# Patient Record
Sex: Male | Born: 1980
Health system: Southern US, Community
[De-identification: ages and names within clinical notes are randomized; demographics above are authoritative.]

## PROBLEM LIST (undated history)

## (undated) DIAGNOSIS — J45909 Unspecified asthma, uncomplicated: Secondary | ICD-10-CM

## (undated) DIAGNOSIS — R51 Headache: Secondary | ICD-10-CM

## (undated) DIAGNOSIS — G8929 Other chronic pain: Secondary | ICD-10-CM

## (undated) HISTORY — PX: BACK SURGERY: SHX140

## (undated) HISTORY — DX: Headache: R51

## (undated) HISTORY — DX: Other chronic pain: G89.29

## (undated) HISTORY — PX: OPEN ANTERIOR SHOULDER RECONSTRUCTION: SHX2100

## (undated) HISTORY — PX: BRONCHOSCOPY: SUR163

## (undated) HISTORY — DX: Unspecified asthma, uncomplicated: J45.909

---

## 2005-09-22 ENCOUNTER — Emergency Department (HOSPITAL_COMMUNITY): Admission: EM | Admit: 2005-09-22 | Discharge: 2005-09-22 | Payer: Self-pay | Admitting: Family Medicine

## 2007-08-06 ENCOUNTER — Encounter: Admission: RE | Admit: 2007-08-06 | Discharge: 2007-08-06 | Payer: Self-pay | Admitting: General Surgery

## 2009-03-11 ENCOUNTER — Encounter: Admission: RE | Admit: 2009-03-11 | Discharge: 2009-03-11 | Payer: Self-pay | Admitting: Internal Medicine

## 2009-03-29 ENCOUNTER — Emergency Department (HOSPITAL_COMMUNITY): Admission: EM | Admit: 2009-03-29 | Discharge: 2009-03-29 | Payer: Self-pay | Admitting: Emergency Medicine

## 2009-04-01 ENCOUNTER — Ambulatory Visit (HOSPITAL_COMMUNITY): Admission: RE | Admit: 2009-04-01 | Discharge: 2009-04-02 | Payer: Self-pay | Admitting: Neurological Surgery

## 2009-04-01 ENCOUNTER — Encounter: Payer: Self-pay | Admitting: Internal Medicine

## 2009-11-04 ENCOUNTER — Encounter: Admission: RE | Admit: 2009-11-04 | Discharge: 2009-11-04 | Payer: Self-pay | Admitting: Orthopedic Surgery

## 2009-12-01 ENCOUNTER — Ambulatory Visit: Payer: Self-pay | Admitting: Internal Medicine

## 2009-12-01 DIAGNOSIS — Z9189 Other specified personal risk factors, not elsewhere classified: Secondary | ICD-10-CM | POA: Insufficient documentation

## 2009-12-01 DIAGNOSIS — R05 Cough: Secondary | ICD-10-CM

## 2009-12-01 DIAGNOSIS — R059 Cough, unspecified: Secondary | ICD-10-CM | POA: Insufficient documentation

## 2010-01-04 ENCOUNTER — Ambulatory Visit: Payer: Self-pay | Admitting: Internal Medicine

## 2010-01-04 DIAGNOSIS — J45909 Unspecified asthma, uncomplicated: Secondary | ICD-10-CM | POA: Insufficient documentation

## 2010-01-04 LAB — CONVERTED CEMR LAB
Basophils Relative: 0.5 % (ref 0.0–3.0)
Eosinophils Absolute: 0.1 10*3/uL (ref 0.0–0.7)
Eosinophils Relative: 2.1 % (ref 0.0–5.0)
Hemoglobin: 15.7 g/dL (ref 13.0–17.0)
Lymphocytes Relative: 35.6 % (ref 12.0–46.0)
MCHC: 34.8 g/dL (ref 30.0–36.0)
MCV: 90.8 fL (ref 78.0–100.0)
Monocytes Absolute: 0.4 10*3/uL (ref 0.1–1.0)
Neutro Abs: 2.5 10*3/uL (ref 1.4–7.7)
RBC: 4.97 M/uL (ref 4.22–5.81)

## 2010-01-09 ENCOUNTER — Ambulatory Visit: Payer: Self-pay | Admitting: Cardiovascular Disease

## 2010-01-17 ENCOUNTER — Ambulatory Visit (HOSPITAL_BASED_OUTPATIENT_CLINIC_OR_DEPARTMENT_OTHER): Admission: RE | Admit: 2010-01-17 | Discharge: 2010-01-17 | Payer: Self-pay | Admitting: Orthopedic Surgery

## 2010-01-17 ENCOUNTER — Telehealth (INDEPENDENT_AMBULATORY_CARE_PROVIDER_SITE_OTHER): Payer: Self-pay | Admitting: *Deleted

## 2010-06-28 ENCOUNTER — Encounter: Admission: RE | Admit: 2010-06-28 | Discharge: 2010-06-28 | Payer: Self-pay | Admitting: Internal Medicine
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
## 2010-10-17 NOTE — Miscellaneous (Signed)
Summary: Orders Update pft charges  Clinical Lists Changes  Orders: Added new Service order of Carbon Monoxide diffusing w/capacity (94720) - Signed Added new Service order of Lung Volumes (94240) - Signed Added new Service order of Spirometry (Pre & Post) (94060) - Signed 

## 2010-10-17 NOTE — Progress Notes (Signed)
Summary: surgery clearance- fax req immediately ----waiting on fax  Phone Note From Other Clinic   Caller: kirsten w/ dr Thurston Hole Call For: wert Summary of Call: pt is having ortho surgery today at 1pm. caller says that they were to receive notes re: surgery clearance asap. says short stay had faxed something as well. fax to attn: kirsten [phone] Initial call taken by: Tivis Ringer, CNA,  Jan 17, 2010 10:45 AM  Follow-up for Phone Call        Verlon Au, have you seen any paperwork come across regarding this pt and surgical clearance?  pt scheduled for surgery today at 1pm.  Arman Filter LPN  Jan 17, 1061 10:58 AM   spoke with leslie. no paperwork on this pt.  called Kirsten with Dr. Thurston Hole at [phone] and requested she fax over surgical clearance papework for MW to fill out.  Waiting on fax. Arman Filter LPN  Jan 17, 2702 11:06 AM   recieved fax and gave to Verlon Au to give to [facility].  Aundra Millet Reynolds LPN  Jan 18, 5008 11:18 AM   Additional Follow-up for Phone Call Additional follow up Details #1::        last ov note from 01-04-2010 with addendum faxed to # above.  Arman Filter LPN  Jan 17, 3817 11:43 AM
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
## 2010-12-05 LAB — CBC
HCT: 44.3 % (ref 39.0–52.0)
MCHC: 35.5 g/dL (ref 30.0–36.0)
MCV: 91.3 fL (ref 78.0–100.0)
RBC: 4.85 MIL/uL (ref 4.22–5.81)

## 2010-12-05 LAB — DIFFERENTIAL
Eosinophils Absolute: 0.1 10*3/uL (ref 0.0–0.7)
Eosinophils Relative: 1 % (ref 0–5)
Lymphs Abs: 1.4 10*3/uL (ref 0.7–4.0)
Monocytes Absolute: 0.4 10*3/uL (ref 0.1–1.0)
Monocytes Relative: 8 % (ref 3–12)

## 2010-12-05 LAB — SEDIMENTATION RATE: Sed Rate: 1 mm/hr (ref 0–16)

## 2010-12-05 LAB — C-REACTIVE PROTEIN: CRP: 0 mg/dL — ABNORMAL LOW (ref <0.6)

## 2010-12-24 LAB — CBC
HCT: 48.9 % (ref 39.0–52.0)
Hemoglobin: 17.2 g/dL — ABNORMAL HIGH (ref 13.0–17.0)
MCHC: 35.2 g/dL (ref 30.0–36.0)
MCV: 90.7 fL (ref 78.0–100.0)
RBC: 5.39 MIL/uL (ref 4.22–5.81)
RDW: 13.3 % (ref 11.5–15.5)

## 2011-01-30 NOTE — Op Note (Signed)
NAME:  Luis [name], Luis [name] NO.:  [id number]   MEDICAL RECORD NO.:  [id number]          PATIENT TYPE:  OIB   LOCATION:  3524                         FACILITY:  [facility]   PHYSICIAN:  Stefani Dama, M.D.  DATE OF BIRTH:  1981/01/27   DATE OF PROCEDURE:  04/01/2009  DATE OF DISCHARGE:                               OPERATIVE REPORT   PREOPERATIVE DIAGNOSIS:  Herniated nucleus pulposus L5-S1 right with  right lumbar radiculopathy.   POSTOPERATIVE DIAGNOSIS:  Herniated nucleus pulposus L5-S1 right with  right lumbar radiculopathy.   PROCEDURES:  Right L5-S1 laminotomy, diskectomy with METRx operating  microscope, microdissection technique.   SURGEON:  Stefani Dama, MD   FIRST ASSISTANT:  Hilda Lias, MD   ANESTHESIA:  General endotracheal.   INDICATIONS:  Luis [name] is a 30 year old individual who has had  significant back and right lower extremity pain with weakness in the  gastroc on the right side.  He was seen in the office and found to be in  acute and severe distress having had failed efforts at conservative  management for the last couple of months.  An MRI demonstrates presence  of a large extruded fragment of disk at L5-S1 on the right side.  He was  advised regarding METRx diskectomy.   PROCEDURE:  The patient was brought to the operating room in supine on  the stretcher.  After smooth induction of general endotracheal  anesthesia, he was turned prone.  The back was prepped with alcohol and  DuraPrep and draped in a sterile fashion.  Fluoroscopic guidance was  then used to localize the interlaminar space on the right side at the L5-  S1 level and both the AP and lateral projections.  A small incision was  made in the skin overlying this area of the entry site and then a K-wire  was passed to laminar arch of L5.  A vertical incision was created along  the K-wire to allow easier access to the dissection.  A series of  dilators were then passed over  the K-wires and a wanding technique was  used to develop an area of the interlaminar space at L5-S1 on the right  side.  Ultimately, an 18-mm diameter cannula x 7 cm in depth was fixed  to the operating table with a clamp, and the soft tissues were then  cleared over the interlaminar space using a monopolar cautery with the  operating microscope in place.  Laminotomy was then created removing the  inferior margin lamina of L5 out to and including the medial wall of the  facet.  Yellow ligament was taken up in this interspace and lateral  recess was cleared.  Takeoff of the L5 nerve root was then identified  superiorly and inferiorly under a substantial mass.  The S1 nerve root  was identified being splayed over a mass.  The S1 nerve root was  carefully dissected and retracted medially, and the mass was identified  as a large pearlescent mass of disk.  Dissection of this thinned  ligament over the disk yielded a large fragment of  disk which was  removed.  Further dissection yielded some other fragments of disk, which  were contiguous with the interspace.  Interspace was then opened and a  diskectomy was performed within the interspace cleaning out both the  medial and lateral degenerated disk material.  Care was taken to make  sure that all the free fragments of disk that could be harvested from  within the disk space itself were removed.  This was done with a  combination of curettes, rongeurs.  Irrigation and probing with various  probes to make sure that the disk space itself was clean.  Once this was  completed, hemostasis in the soft tissues was obtained and the wound was  inspected carefully.  The S1 nerve root was noted to be well  decompressed and laid flat across the interspace.  The L5 nerve root was  noted to be intact, dural tube was well decompressed.  Hemostasis was  well maintained.  At this point, endoscopic cannula was removed.  The  lumbodorsal fascia was closed with  single layer of 3-0 Vicryl stitch and  3-0 Vicryl was used in the subcuticular tissues.  Blood loss was  estimated about 50 mL.  The patient tolerated the procedure well and was  returned to recovery room in stable condition.      Stefani Dama, M.D.  Electronically Signed     HJE/MEDQ  D:  04/01/2009  T:  04/02/2009  Job:  [id number]

## 2014-05-06 ENCOUNTER — Other Ambulatory Visit: Payer: Self-pay | Admitting: Internal Medicine

## 2014-05-06 DIAGNOSIS — M545 Low back pain, unspecified: Secondary | ICD-10-CM

## 2014-05-06 DIAGNOSIS — M546 Pain in thoracic spine: Secondary | ICD-10-CM

## 2014-05-13 ENCOUNTER — Other Ambulatory Visit: Payer: Self-pay

## 2014-05-19 ENCOUNTER — Ambulatory Visit
Admission: RE | Admit: 2014-05-19 | Discharge: 2014-05-19 | Disposition: A | Discharge status: Home / Self Care | Payer: BC Managed Care – PPO | Source: Ambulatory Visit | Attending: Internal Medicine | Admitting: Internal Medicine

## 2014-05-19 DIAGNOSIS — M546 Pain in thoracic spine: Secondary | ICD-10-CM

## 2014-05-19 DIAGNOSIS — M545 Low back pain, unspecified: Secondary | ICD-10-CM

## 2014-05-19 MED ORDER — GADOBENATE DIMEGLUMINE 529 MG/ML IV SOLN
20.0000 mL | Freq: Once | INTRAVENOUS | Status: AC | PRN
  Administered 2014-05-19: 20 mL via INTRAVENOUS

## 2016-06-04 NOTE — Progress Notes (Signed)
Luis [name], this gentleman would like to see me in clinic, next available consult please

## 2016-06-09 NOTE — Progress Notes (Signed)
He has been told he has bronchiectasis and [facility] and now wants to see me.  Dr. Ninetta LightsHatcher called me to discuss and requested I see him about a week ago.  No rush, just next available consult.

## 2016-07-19 ENCOUNTER — Ambulatory Visit (INDEPENDENT_AMBULATORY_CARE_PROVIDER_SITE_OTHER): Payer: BLUE CROSS/BLUE SHIELD | Admitting: Pulmonary Disease

## 2016-07-19 ENCOUNTER — Encounter: Payer: Self-pay | Admitting: Pulmonary Disease

## 2016-07-19 VITALS — BP 126/78 | HR 83 | Ht 71.0 in | Wt 247.0 lb

## 2016-07-19 DIAGNOSIS — J479 Bronchiectasis, uncomplicated: Secondary | ICD-10-CM | POA: Diagnosis not present
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
## 2016-07-19 NOTE — Patient Instructions (Signed)
We will arrange for a sputum culture, high-resolution CT scan of your chest in lung function test We will see you back in 4-6 weeks to go over these results

## 2016-08-03 ENCOUNTER — Other Ambulatory Visit: Payer: BLUE CROSS/BLUE SHIELD

## 2016-08-03 ENCOUNTER — Ambulatory Visit (INDEPENDENT_AMBULATORY_CARE_PROVIDER_SITE_OTHER)
Admission: RE | Admit: 2016-08-03 | Discharge: 2016-08-03 | Disposition: A | Discharge status: Home / Self Care | Payer: BLUE CROSS/BLUE SHIELD | Source: Ambulatory Visit | Attending: Pulmonary Disease | Admitting: Pulmonary Disease

## 2016-08-03 DIAGNOSIS — J479 Bronchiectasis, uncomplicated: Secondary | ICD-10-CM | POA: Diagnosis not present

## 2016-08-03 DIAGNOSIS — R05 Cough: Secondary | ICD-10-CM | POA: Diagnosis not present

## 2016-08-07 LAB — RESPIRATORY CULTURE OR RESPIRATORY AND SPUTUM CULTURE: Gram Stain: NONE SEEN

## 2016-08-23 ENCOUNTER — Ambulatory Visit (INDEPENDENT_AMBULATORY_CARE_PROVIDER_SITE_OTHER): Payer: BLUE CROSS/BLUE SHIELD | Admitting: Pulmonary Disease

## 2016-08-23 ENCOUNTER — Encounter: Payer: Self-pay | Admitting: Pulmonary Disease

## 2016-08-23 VITALS — BP 122/80 | HR 88 | Ht 71.0 in | Wt 248.0 lb

## 2016-08-23 DIAGNOSIS — J479 Bronchiectasis, uncomplicated: Secondary | ICD-10-CM

## 2016-08-23 DIAGNOSIS — K760 Fatty (change of) liver, not elsewhere classified: Secondary | ICD-10-CM

## 2016-08-23 LAB — PULMONARY FUNCTION TEST
DL/VA % pred: 108 %
DL/VA: 5.15 ml/min/mmHg/L
DLCO UNC: 35.63 ml/min/mmHg
DLCO cor % pred: 100 %
DLCO cor: 33.78 ml/min/mmHg
DLCO unc % pred: 105 %
FEF 25-75 PRE: 2.51 L/s
FEF 25-75 Post: 3.19 L/sec
FEF2575-%Change-Post: 27 %
FEF2575-%PRED-POST: 74 %
FEF2575-%Pred-Pre: 58 %
FEV1-%CHANGE-POST: 8 %
FEV1-%PRED-POST: 80 %
FEV1-%PRED-PRE: 74 %
FEV1-PRE: 3.31 L
FEV1-Post: 3.58 L
FEV1FVC-%Change-Post: 2 %
FEV1FVC-%Pred-Pre: 87 %
FEV6-%CHANGE-POST: 7 %
FEV6-%PRED-POST: 90 %
FEV6-%PRED-PRE: 84 %
FEV6-POST: 4.92 L
FEV6-Pre: 4.59 L
FEV6FVC-%CHANGE-POST: 1 %
FEV6FVC-%PRED-PRE: 100 %
FEV6FVC-%Pred-Post: 101 %
FVC-%CHANGE-POST: 5 %
FVC-%Pred-Post: 88 %
FVC-%Pred-Pre: 83 %
FVC-Post: 4.92 L
FVC-Pre: 4.65 L
POST FEV6/FVC RATIO: 100 %
PRE FEV6/FVC RATIO: 99 %
Post FEV1/FVC ratio: 73 %
Pre FEV1/FVC ratio: 71 %
RV % PRED: 71 %
RV: 1.29 L
TLC % pred: 89 %
TLC: 6.4 L
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
## 2016-08-23 NOTE — Patient Instructions (Signed)
For your bronchiectasis: Clear out mucus twice a day with coughing technique or with the flutter valve If you find that you have increasing congestion, shortness of breath, or mucus production then I recommend that she use the flutter valve 4-5 breaths, 4-5 times a day. If your symptoms worsen or persist despite this for 3-4 days and call me and I will call in a prescription for an antibiotic. Focus on good nutrition, specifically whole foods fresh fruits and vegetables  For the fatty liver: I recommend that she follow-up with her primary care physician and talk about this further.  We will see you back in one year or sooner if needed

## 2016-08-29 DIAGNOSIS — M4714 Other spondylosis with myelopathy, thoracic region: Secondary | ICD-10-CM | POA: Diagnosis not present

## 2016-08-30 ENCOUNTER — Telehealth: Payer: Self-pay | Admitting: Pulmonary Disease

## 2016-08-30 NOTE — Telephone Encounter (Signed)
LMTCB

## 2016-09-03 NOTE — Telephone Encounter (Signed)
Called and spoke to pt. Pt is requesting to pick up sputum results. Results have been printed and placed up front for pick up.  Pt verbalized understanding and denied any further questions or concerns at this time.

## 2016-09-06 DIAGNOSIS — M546 Pain in thoracic spine: Secondary | ICD-10-CM | POA: Diagnosis not present

## 2016-09-06 DIAGNOSIS — M4714 Other spondylosis with myelopathy, thoracic region: Secondary | ICD-10-CM | POA: Diagnosis not present

## 2016-09-10 LAB — FUNGUS CULTURE W SMEAR

## 2016-09-20 LAB — AFB CULTURE WITH SMEAR (NOT AT ARMC)

## 2016-10-11 DIAGNOSIS — M542 Cervicalgia: Secondary | ICD-10-CM | POA: Diagnosis not present

## 2016-10-11 DIAGNOSIS — M25532 Pain in left wrist: Secondary | ICD-10-CM | POA: Diagnosis not present

## 2016-10-11 DIAGNOSIS — M25522 Pain in left elbow: Secondary | ICD-10-CM | POA: Diagnosis not present

## 2016-10-22 DIAGNOSIS — M25832 Other specified joint disorders, left wrist: Secondary | ICD-10-CM | POA: Diagnosis not present

## 2016-10-22 DIAGNOSIS — R52 Pain, unspecified: Secondary | ICD-10-CM | POA: Diagnosis not present

## 2016-10-30 ENCOUNTER — Other Ambulatory Visit: Payer: Self-pay | Admitting: Orthopedic Surgery

## 2016-10-30 DIAGNOSIS — M25832 Other specified joint disorders, left wrist: Secondary | ICD-10-CM

## 2016-10-30 DIAGNOSIS — R52 Pain, unspecified: Secondary | ICD-10-CM | POA: Diagnosis not present

## 2016-11-01 DIAGNOSIS — M4714 Other spondylosis with myelopathy, thoracic region: Secondary | ICD-10-CM | POA: Diagnosis not present

## 2016-11-19 ENCOUNTER — Ambulatory Visit
Admission: RE | Admit: 2016-11-19 | Discharge: 2016-11-19 | Disposition: A | Discharge status: Home / Self Care | Payer: BLUE CROSS/BLUE SHIELD | Source: Ambulatory Visit | Attending: Orthopedic Surgery | Admitting: Orthopedic Surgery

## 2016-11-19 DIAGNOSIS — S63502A Unspecified sprain of left wrist, initial encounter: Secondary | ICD-10-CM | POA: Diagnosis not present

## 2016-11-19 DIAGNOSIS — M25832 Other specified joint disorders, left wrist: Secondary | ICD-10-CM

## 2016-11-19 DIAGNOSIS — M25532 Pain in left wrist: Secondary | ICD-10-CM | POA: Diagnosis not present

## 2016-11-19 MED ORDER — IOPAMIDOL (ISOVUE-M 200) INJECTION 41%
2.0000 mL | Freq: Once | INTRAMUSCULAR | Status: AC
  Administered 2016-11-19: 2 mL via INTRA_ARTICULAR

## 2016-11-26 DIAGNOSIS — M25532 Pain in left wrist: Secondary | ICD-10-CM | POA: Diagnosis not present

## 2016-11-26 DIAGNOSIS — M24832 Other specific joint derangements of left wrist, not elsewhere classified: Secondary | ICD-10-CM | POA: Diagnosis not present

## 2016-12-13 ENCOUNTER — Telehealth: Payer: Self-pay | Admitting: Pulmonary Disease

## 2016-12-13 NOTE — Telephone Encounter (Signed)
BQ  Please Advise- Sick Message   Pt called in stating for the past 3-4 days, His throat has been irritated, and experiencing  right ear pain,does have a cough, coughing up greenish to yellow phlegm with some hints of blood, Denies increase sob,wheezing,fever. Pt wanted to know if an antibiotic could be called in.    No Known Allergies

## 2016-12-13 NOTE — Telephone Encounter (Signed)
lmomtcb x1 

## 2016-12-13 NOTE — Telephone Encounter (Signed)
cipro 750 mg bid x 7 days Take with a probiotic

## 2016-12-17 NOTE — Telephone Encounter (Signed)
lmtcb for pt.  

## 2016-12-18 NOTE — Telephone Encounter (Signed)
Spoke with the pt  He states currently out of town due to death of a family member  He does not feel like he needs rx for abx at this time He will call us when he gets back if needed

## 2017-05-17 ENCOUNTER — Encounter (HOSPITAL_COMMUNITY): Payer: Self-pay | Admitting: *Deleted

## 2017-05-17 ENCOUNTER — Emergency Department (HOSPITAL_COMMUNITY): Payer: BLUE CROSS/BLUE SHIELD

## 2017-05-17 ENCOUNTER — Emergency Department (HOSPITAL_COMMUNITY)
Admission: EM | Admit: 2017-05-17 | Discharge: 2017-05-17 | Disposition: A | Discharge status: Home / Self Care | Payer: BLUE CROSS/BLUE SHIELD | Attending: Emergency Medicine | Admitting: Emergency Medicine

## 2017-05-17 DIAGNOSIS — R2689 Other abnormalities of gait and mobility: Secondary | ICD-10-CM

## 2017-05-17 DIAGNOSIS — J45909 Unspecified asthma, uncomplicated: Secondary | ICD-10-CM | POA: Diagnosis not present

## 2017-05-17 DIAGNOSIS — R270 Ataxia, unspecified: Secondary | ICD-10-CM | POA: Diagnosis not present

## 2017-05-17 DIAGNOSIS — R51 Headache: Secondary | ICD-10-CM | POA: Diagnosis not present

## 2017-05-17 DIAGNOSIS — R519 Headache, unspecified: Secondary | ICD-10-CM

## 2017-05-17 LAB — BASIC METABOLIC PANEL
Anion gap: 8 (ref 5–15)
BUN: 12 mg/dL (ref 6–20)
CALCIUM: 9.1 mg/dL (ref 8.9–10.3)
CHLORIDE: 108 mmol/L (ref 101–111)
CO2: 24 mmol/L (ref 22–32)
CREATININE: 0.88 mg/dL (ref 0.61–1.24)
GFR calc Af Amer: >60 mL/min (ref >60)
GFR calc non Af Amer: >60 mL/min (ref >60)
GLUCOSE: 98 mg/dL (ref 65–99)
POTASSIUM: 4.8 mmol/L (ref 3.5–5.1)
Sodium: 140 mmol/L (ref 135–145)

## 2017-05-17 LAB — CBC
HEMATOCRIT: 46.7 % (ref 39.0–52.0)
HEMOGLOBIN: 15.7 g/dL (ref 13.0–17.0)
MCH: 31.7 pg (ref 26.0–34.0)
MCHC: 33.6 g/dL (ref 30.0–36.0)
MCV: 94.2 fL (ref 78.0–100.0)
Platelets: 196 10*3/uL (ref 150–400)
RBC: 4.96 MIL/uL (ref 4.22–5.81)
RDW: 12.6 % (ref 11.5–15.5)
WBC: 5.5 10*3/uL (ref 4.0–10.5)

## 2017-05-17 MED ORDER — METOCLOPRAMIDE HCL 10 MG PO TABS
10.0000 mg | ORAL_TABLET | Freq: Once | ORAL | Status: AC
  Administered 2017-05-17: 10 mg via ORAL
  Filled 2017-05-17: qty 1

## 2017-05-17 MED ORDER — GADOBENATE DIMEGLUMINE 529 MG/ML IV SOLN
20.0000 mL | Freq: Once | INTRAVENOUS | Status: AC
  Administered 2017-05-17: 20 mL via INTRAVENOUS

## 2017-05-17 MED ORDER — MECLIZINE HCL 25 MG PO TABS: 25.0000 mg | ORAL_TABLET | Freq: Three times a day (TID) | ORAL | 0 refills | Status: DC | PRN

## 2017-05-17 MED ORDER — DIPHENHYDRAMINE HCL 25 MG PO CAPS
25.0000 mg | ORAL_CAPSULE | Freq: Once | ORAL | Status: AC
  Administered 2017-05-17: 25 mg via ORAL
  Filled 2017-05-17: qty 1

## 2017-05-17 NOTE — Discharge Instructions (Signed)
Stay well hydrated.  Take meclizine as needed for dizziness. This can sometimes make people tired, so have caution while taking it before driving or operating heavy machinery. You may use Tylenol or ibuprofen as needed for headache. Have caution when walking to prevent falls.  Do not drive if symptoms persist. Follow-up with neurology.  Return to emergency department if you develop vomiting, fevers, chills, loss of bowel or bladder control, vision changes, or any new or worsening symptoms.
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
## 2017-05-17 NOTE — ED Notes (Signed)
Patient transported to CT 

## 2017-05-17 NOTE — ED Triage Notes (Signed)
Pt reports intermittent sharp stabbing pains to top of his head. The pain started yesterday. Reports that when pain occurs, it causes his right leg to "jump" and he has some numbness to right side of face. No neuro deficits are noted at triage, ambulatory on arrival. Episodes last only a few seconds each.

## 2017-05-17 NOTE — ED Notes (Signed)
Patient transported to MRI 

## 2017-05-29 ENCOUNTER — Telehealth: Payer: Self-pay | Admitting: Pulmonary Disease

## 2017-05-29 MED ORDER — LEVOFLOXACIN 750 MG PO TABS: 750.0000 mg | ORAL_TABLET | Freq: Every day | ORAL | 0 refills | Status: DC

## 2017-05-29 NOTE — Telephone Encounter (Signed)
Spoke with patient. He is aware of BQ's recs. Will go ahead and order the abx. He is aware. Nothing else needed at time of call.

## 2017-05-29 NOTE — Telephone Encounter (Signed)
Levaquin 750mg  daily x7 days Urologist: I really like [employer]ell ChemicalMatt Eskridge

## 2017-05-29 NOTE — Telephone Encounter (Signed)
Spoke with patient. He stated that his sore throat and cough started last night. Productive cough with green mucus. Denies any SOB or chest pain. He also has a slight headache. He wants to know if BQ could call in an abx to help with his symptoms.   Wishes to use [facility] on [address].   He also wanted to know if BQ could write a referral for him to see a urologist. He explained that he was in the hospital a few weeks ago for a UTI and they told him he needed one. He really trusts BQ's advice and only wants his opinion on a urologist.   BQ, please advise.

## 2017-09-18 ENCOUNTER — Telehealth: Payer: Self-pay | Admitting: Pulmonary Disease

## 2017-09-18 MED ORDER — LEVOFLOXACIN 750 MG PO TABS: 750.0000 mg | ORAL_TABLET | Freq: Every day | ORAL | 0 refills | Status: DC

## 2017-09-18 NOTE — Telephone Encounter (Signed)
Spoke with pt. He is aware of BQ's recommendations. Rx has been sent in. Nothing further was needed. 

## 2017-09-18 NOTE — Telephone Encounter (Signed)
Levaquin 750mg  po daily x 7 days Take with probiotic, fluids Call if no improvement

## 2017-09-18 NOTE — Telephone Encounter (Signed)
Called pt who stated that Saturday, 09/15/17 woke up with a high fever of 103, drainage, headache, chills, and cough. Fever broke Saturday night but then fever came back some Sunday.  Pt still having symptoms of sore throat, headache, cough with green mucus and is wanting to know if an Rx can be sent in to help with symptoms.  Dr. Kendrick FriesMcQuaid, please advise on this.  Thanks!

## 2017-09-27 DIAGNOSIS — M5481 Occipital neuralgia: Secondary | ICD-10-CM | POA: Diagnosis not present

## 2017-10-10 ENCOUNTER — Emergency Department (HOSPITAL_COMMUNITY)
Admission: EM | Admit: 2017-10-10 | Discharge: 2017-10-10 | Disposition: A | Discharge status: Home / Self Care | Payer: BLUE CROSS/BLUE SHIELD | Attending: Emergency Medicine | Admitting: Emergency Medicine

## 2017-10-10 ENCOUNTER — Other Ambulatory Visit: Payer: Self-pay

## 2017-10-10 ENCOUNTER — Encounter (HOSPITAL_COMMUNITY): Payer: Self-pay

## 2017-10-10 DIAGNOSIS — J45909 Unspecified asthma, uncomplicated: Secondary | ICD-10-CM | POA: Diagnosis not present

## 2017-10-10 DIAGNOSIS — M5441 Lumbago with sciatica, right side: Secondary | ICD-10-CM | POA: Diagnosis not present

## 2017-10-10 DIAGNOSIS — W19XXXA Unspecified fall, initial encounter: Secondary | ICD-10-CM

## 2017-10-10 DIAGNOSIS — M545 Low back pain: Secondary | ICD-10-CM | POA: Diagnosis present

## 2017-10-10 MED ORDER — DEXAMETHASONE SODIUM PHOSPHATE 10 MG/ML IJ SOLN
10.0000 mg | Freq: Once | INTRAMUSCULAR | Status: AC
  Administered 2017-10-10: 10 mg via INTRAMUSCULAR
  Filled 2017-10-10: qty 1

## 2017-10-10 MED ORDER — CYCLOBENZAPRINE HCL 10 MG PO TABS
5.0000 mg | ORAL_TABLET | Freq: Once | ORAL | Status: DC
  Filled 2017-10-10: qty 1

## 2017-10-10 MED ORDER — KETOROLAC TROMETHAMINE 15 MG/ML IJ SOLN
15.0000 mg | Freq: Once | INTRAMUSCULAR | Status: DC
  Filled 2017-10-10: qty 1

## 2017-10-10 MED ORDER — CYCLOBENZAPRINE HCL 10 MG PO TABS: 10.0000 mg | ORAL_TABLET | Freq: Two times a day (BID) | ORAL | 0 refills | Status: DC | PRN

## 2017-10-10 MED ORDER — IBUPROFEN 800 MG PO TABS
800.0000 mg | ORAL_TABLET | Freq: Once | ORAL | Status: AC
  Administered 2017-10-10: 800 mg via ORAL
  Filled 2017-10-10: qty 1
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
## 2017-10-10 NOTE — ED Provider Notes (Signed)
The patient is a 37 year old male, he has a known his prior lumbar surgery, he had this surgery in the past when he had a disc injury, he is required 2 separate surgeries and did great with both of them however he states that in the last couple of weeks he had an incident where a keg of beer was falling down the stairs and when he reached out to grab for it he had acute onset of pain and since that time has had an area of numbness and pain that radiates around from his right lower back around the proximal thigh over to the medial thigh.  This is right in the middle of the thigh, it does not involve the groin, the scrotum, the penis or the testicles.  He has no weakness of the leg and on my exam in fact he has a very normal neurologic exam except for a very slight area of decreased sensation to the mid anterior and lateral thigh rotating up towards the lower back.  He is able to flex with normal strength at the hips and the knees and extend and dorsiflex and plantarflex at the feet, normal ability to raise the great toes and normal reflexes.  I suspect that the patient has an L3 radiculopathy, he has a benign abdomen on my exam.  The patient is stable for discharge on Decadron and Toradol, he declines hydrocodone and Artie has an appointment with his neurosurgeon.  He is aware of the indications for return all the red flags were explained.  Medications  dexamethasone (DECADRON) injection 10 mg (10 mg Intramuscular Given 10/10/17 2314)  ibuprofen (ADVIL,MOTRIN) tablet 800 mg (800 mg Oral Given 10/10/17 2325)   I saw and evaluated the patient, reviewed the resident's note and I agree with the findings and plan.   Final diagnoses:  Acute right-sided low back pain with right-sided sciatica  Fall, initial encounter      Eber HongMiller, Alichia Alridge, MD 10/11/17 1034

## 2017-10-10 NOTE — ED Triage Notes (Signed)
Pt states he fell today and has lower back pain. Hx of severe back pain with injury. Pt states pain radiates into his left leg. States he now has pain in his right testicle. Ambulatory.

## 2017-10-10 NOTE — ED Notes (Signed)
Patient reports that he is here due to fall that "exacerbate my back pain". He reports that he has had two "back surgeries" in the past. He denies taking any home pain meds. He rates his pain at 7 on a 0-10scale

## 2017-11-21 ENCOUNTER — Telehealth: Payer: Self-pay | Admitting: Pulmonary Disease

## 2017-11-21 MED ORDER — DOXYCYCLINE HYCLATE 100 MG PO TABS: 100.0000 mg | ORAL_TABLET | Freq: Two times a day (BID) | ORAL | 0 refills | Status: DC

## 2017-11-21 NOTE — Telephone Encounter (Signed)
Offer doxycycline 100 mg, # 14, 1 twice daily 

## 2017-11-21 NOTE — Telephone Encounter (Addendum)
lmtcb for pt.  Rx sent to pharmacy pt requested when he called in - [facility] on [address].

## 2017-11-21 NOTE — Telephone Encounter (Signed)
Pt returned call. Informed him of the recs per CY. Pt verbalized understanding and denied any further questions or concerns at this time.   

## 2017-11-21 NOTE — Telephone Encounter (Signed)
Pt reports of prod cough with bright green mucus & chest tightness x1d Denies fever, chills, sweats or body aches.  recently flew and thinks he picked something up from the plane.   Pt was last seen 08/2016 and instructed to f/u in one year. Pt did not f/u. Over due f/u has been scheduled for 12/16/17.  CY please advise, as BQ is unavailable. Thanks  Current Outpatient Medications on File Prior to Visit  Medication Sig Dispense Refill  . cyclobenzaprine (FLEXERIL) 10 MG tablet Take 1 tablet (10 mg total) by mouth 2 (two) times daily as needed for muscle spasms. 20 tablet 0  . ibuprofen (ADVIL,MOTRIN) 200 MG tablet Take 600-800 mg by mouth every 6 (six) hours as needed for headache.    . levofloxacin (LEVAQUIN) 750 MG tablet Take 1 tablet (750 mg total) by mouth daily. (Patient not taking: Reported on 10/10/2017) 7 tablet 0  . meclizine (ANTIVERT) 25 MG tablet Take 1 tablet (25 mg total) by mouth 3 (three) times daily as needed for dizziness. (Patient not taking: Reported on 10/10/2017) 30 tablet 0   No current facility-administered medications on file prior to visit.     No Known Allergies

## 2017-11-22 ENCOUNTER — Telehealth: Payer: Self-pay | Admitting: Pulmonary Disease

## 2017-11-22 ENCOUNTER — Encounter: Payer: Self-pay | Admitting: Internal Medicine

## 2017-11-22 ENCOUNTER — Ambulatory Visit (INDEPENDENT_AMBULATORY_CARE_PROVIDER_SITE_OTHER): Payer: BLUE CROSS/BLUE SHIELD | Admitting: Internal Medicine

## 2017-11-22 ENCOUNTER — Ambulatory Visit (INDEPENDENT_AMBULATORY_CARE_PROVIDER_SITE_OTHER)
Admission: RE | Admit: 2017-11-22 | Discharge: 2017-11-22 | Disposition: A | Discharge status: Home / Self Care | Payer: BLUE CROSS/BLUE SHIELD | Source: Ambulatory Visit | Attending: Internal Medicine | Admitting: Internal Medicine

## 2017-11-22 VITALS — BP 118/86 | HR 118 | Temp 102.8°F | Ht 71.0 in | Wt 242.0 lb

## 2017-11-22 DIAGNOSIS — R05 Cough: Secondary | ICD-10-CM | POA: Diagnosis not present

## 2017-11-22 DIAGNOSIS — J479 Bronchiectasis, uncomplicated: Secondary | ICD-10-CM | POA: Diagnosis not present

## 2017-11-22 DIAGNOSIS — J101 Influenza due to other identified influenza virus with other respiratory manifestations: Secondary | ICD-10-CM

## 2017-11-22 DIAGNOSIS — R0602 Shortness of breath: Secondary | ICD-10-CM | POA: Diagnosis not present

## 2017-11-22 DIAGNOSIS — R059 Cough, unspecified: Secondary | ICD-10-CM

## 2017-11-22 LAB — POCT INFLUENZA A/B
INFLUENZA A, POC: POSITIVE — AB
Influenza B, POC: NEGATIVE

## 2017-11-22 MED ORDER — OSELTAMIVIR PHOSPHATE 75 MG PO CAPS: 75.0000 mg | ORAL_CAPSULE | Freq: Two times a day (BID) | ORAL | 0 refills | Status: DC

## 2017-11-22 MED ORDER — ACETAMINOPHEN-CODEINE #3 300-30 MG PO TABS: ORAL_TABLET | ORAL | 0 refills | Status: DC

## 2017-11-22 NOTE — Patient Instructions (Addendum)
Finish the doxycycline  Tamiflu 75 mg bid x  5 days as you tested positive for Influenza A so avoid public contacts x 5 days   For cough mucinex dm 1200 mg every 12 hours as needed and if needed Tyenol # 3 for severe cough or pain up to every 4 hours as needed  Plenty of fluids especially soups   Please remember to go to the  x-ray department downstairs in the basement  for your tests - we will call you with the results when they are available.

## 2017-11-22 NOTE — Progress Notes (Signed)
Subjective:     Patient ID: Luis [name], male   DOB: 04/02/1981,   MRN: [id number]  HPI  [age] yowm never smoker with min bronchiectasis on HRCT 08/03/16  most prominent in LUL followed by Dr Kendrick FriesMcQuaid with h/o Torrie MayersBurkolderia Cepacia colonization seen acutely as work in 11/22/2017 for cough and fever.    11/21/17 phone call: Pt reports of prod cough with bright green mucus & chest tightness x1d Denies fever, chills, sweats or body aches.  recently flew and thinks he picked something up from the plane.   Pt was last seen 08/2016 and instructed to f/u in one year. Pt did not f/u. Over due f/u has been scheduled for 12/16/17. rx doxy per Dr Maple HudsonYoung   11/22/2017 acute extended ov/Wert re:  Fever, cough, body aches  >   POS FLU A swab 11/22/2017  Chief Complaint  Patient presents with  . Acute Visit    Woke up with cough early 1 day ago- producing some green sputum. He feels some tightness in his chest. He has body aches and fever also.   acutely ill since 11/20/17 as above/ mild gen chest tightness but nothing pleuritic and no sob at rest but severe coughing fits / not using inhalers  No appetite but keeping fluids down s nausea    No obvious day to day or daytime variability or assoc  mucus plugs or hemoptysis or cp or  subjective wheeze or overt sinus or hb symptoms. No unusual exposure hx or h/o childhood pna/ asthma or knowledge of premature birth.  Also denies any obvious fluctuation of symptoms with weather or environmental changes or other aggravating or alleviating factors except as outlined above.  Current Allergies, Complete Past Medical History, Past Surgical History, Family History, and Social History were reviewed in [name] electronic medical record.  ROS  The following are not active complaints unless bolded Hoarseness, sore throat, dysphagia, dental problems, itching, sneezing,  nasal congestion or discharge of excess mucus or purulent secretions, ear ache,   fever, chills, sweats,  unintended wt loss or wt gain, classically pleuritic or exertional cp,  orthopnea pnd or leg swelling, presyncope, palpitations, abdominal pain, anorexia, nausea, vomiting, diarrhea  or change in bowel habits or change in bladder habits, change in stools or change in urine, dysuria, hematuria,  rash, arthralgias, visual complaints, headache, numbness, weakness or ataxia or problems with walking or coordination,  change in mood/affect or memory.        Current Meds  Medication Sig  . doxycycline (VIBRA-TABS) 100 MG tablet Take 1 tablet (100 mg total) by mouth 2 (two) times daily.  Marland Kitchen. ibuprofen (ADVIL,MOTRIN) 200 MG tablet Take 600-800 mg by mouth every 6 (six) hours as needed for headache.  . [DISCONTINUED] meclizine (ANTIVERT) 25 MG tablet Take 1 tablet (25 mg total) by mouth 3 (three) times daily as needed for dizziness.        Review of Systems     Objective:   Physical Exam    amb wm nad    Wt Readings from Last 3 Encounters:  11/22/17 242 lb (109.8 kg)  08/23/16 248 lb (112.5 kg)  07/19/16 247 lb (112 kg)     Vital signs reviewed - Note on arrival 02 sats  96% on RA and pulse 118 noted   HEENT: nl dentition, turbinates bilaterally, and oropharynx. Nl external ear canals without cough reflex   NECK :  without JVD/Nodes/TM/ nl carotid upstrokes bilaterally   LUNGS: no acc muscle use,  Nl contour chest which is clear to A and P bilaterally without cough on insp or exp maneuvers   CV:  RRR  no s3 or murmur or increase in P2, and no edema   ABD:  soft and nontender with nl inspiratory excursion in the supine position. No bruits or organomegaly appreciated, bowel sounds nl  MS:  Nl gait/ ext warm without deformities, calf tenderness, cyanosis or clubbing No obvious joint restrictions   SKIN: warm and dry without lesions    NEURO:  alert, approp, nl sensorium with  no motor or cerebellar deficits apparent.     CXR PA and Lateral:   11/22/2017 :    I personally reviewed  images and agree with radiology impression as follows:   No active cardiopulmonary disease.           Assessment:

## 2017-11-22 NOTE — Telephone Encounter (Signed)
Pt c/o temp of 103, prod cough with green mucus, body aches, chills, loss of voice- states this has gotten rapidly worse X1 day.  Pt scheduled to see MW today at 1:30- has not been seen in over 1 year.  Nothing further needed.

## 2017-11-22 NOTE — Progress Notes (Signed)
Spoke with pt and notified of results per Dr. Wert. Pt verbalized understanding and denied any questions. 

## 2017-11-23 ENCOUNTER — Encounter: Payer: Self-pay | Admitting: Internal Medicine

## 2017-11-23 NOTE — Assessment & Plan Note (Addendum)
08/06/2016 HRCT> mild cylindrical bronchiectasis most prominent in the LUL 08/03/2016 Sputum culture> burkhoderia cepacia sensitive to cipro/levaquin   No evidence of bronchiectasis flare or need for saba at this point > rx symptoms as cough a/p   I had an extended discussion with the patient and wife reviewing all relevant studies completed to date and  lasting 15 to 20 minutes of a 25 minute acute office  visit    Each maintenance medication was reviewed in detail including most importantly the difference between maintenance and prns and under what circumstances the prns are to be triggered using an action plan format that is not reflected in the computer generated alphabetically organized AVS.    Please see AVS for specific instructions unique to this visit that I personally wrote and verbalized to the the pt in detail and then reviewed with pt  by my nurse highlighting any  changes in therapy recommended at today's visit to their plan of care.

## 2017-11-23 NOTE — Assessment & Plan Note (Signed)
Already on doxy and keeping it and fluids down fine so rec tamiflu 75 bid x 5 days and finish the doxy

## 2017-11-23 NOTE — Assessment & Plan Note (Signed)
Control with mucinex dm/ tyl #3 prn

## 2017-12-16 ENCOUNTER — Ambulatory Visit: Payer: BLUE CROSS/BLUE SHIELD | Admitting: Pulmonary Disease

## 2017-12-16 ENCOUNTER — Other Ambulatory Visit (INDEPENDENT_AMBULATORY_CARE_PROVIDER_SITE_OTHER): Payer: BLUE CROSS/BLUE SHIELD

## 2017-12-16 ENCOUNTER — Encounter: Payer: Self-pay | Admitting: Pulmonary Disease

## 2017-12-16 VITALS — BP 110/80 | HR 101 | Ht 71.0 in | Wt 239.0 lb

## 2017-12-16 DIAGNOSIS — J479 Bronchiectasis, uncomplicated: Secondary | ICD-10-CM

## 2017-12-16 LAB — CBC WITH DIFFERENTIAL/PLATELET
BASOS PCT: 0.3 % (ref 0.0–3.0)
Basophils Absolute: 0 10*3/uL (ref 0.0–0.1)
EOS ABS: 0.2 10*3/uL (ref 0.0–0.7)
Eosinophils Relative: 3 % (ref 0.0–5.0)
HEMATOCRIT: 45.1 % (ref 39.0–52.0)
Hemoglobin: 15.6 g/dL (ref 13.0–17.0)
LYMPHS ABS: 2.1 10*3/uL (ref 0.7–4.0)
LYMPHS PCT: 32.5 % (ref 12.0–46.0)
MCHC: 34.7 g/dL (ref 30.0–36.0)
MCV: 92.2 fl (ref 78.0–100.0)
MONO ABS: 0.8 10*3/uL (ref 0.1–1.0)
Monocytes Relative: 11.9 % (ref 3.0–12.0)
NEUTROS ABS: 3.3 10*3/uL (ref 1.4–7.7)
NEUTROS PCT: 52.3 % (ref 43.0–77.0)
PLATELETS: 214 10*3/uL (ref 150.0–400.0)
RBC: 4.89 Mil/uL (ref 4.22–5.81)
RDW: 13.8 % (ref 11.5–15.5)
WBC: 6.4 10*3/uL (ref 4.0–10.5)
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
## 2017-12-16 NOTE — Patient Instructions (Signed)
Bronchiectasis with recurrent exacerbations: We will collect a sample of your mucus and send it for culture for bacteria, fungus, AFB organisms We will collect blood work to look for evidence for an allergic reaction to a mold type organism which can cause flares of bronchiectasis If you have excessive chest congestion or heaviness please let me know and I will call in a prescription for something called hypertonic saline which she would take through a nebulizer twice a day We will bring you back in 6-8 weeks after the cultures have grown out and go over the results and discuss if there is something else we need to consider (like a CT scan or bronchoscopy).  Follow-up in 6-8 weeks

## 2017-12-17 LAB — IGE: IGE (IMMUNOGLOBULIN E), SERUM: 3 kU/L (ref <=114)

## 2018-01-23 ENCOUNTER — Telehealth: Payer: Self-pay | Admitting: Pulmonary Disease

## 2018-01-23 NOTE — Telephone Encounter (Signed)
Keep appointment, would keep trying to give a sample

## 2018-01-23 NOTE — Telephone Encounter (Signed)
Spoke with pt, he wanted to see if BQ still wanted to see him without sputum culture results because he will not be able to get the specimen in and resulted by 5/14. BQ should he reschedule or do you still want him to come in for the appt? Please advise.

## 2018-01-23 NOTE — Telephone Encounter (Signed)
lmtcb x1 for pt. 

## 2018-01-24 NOTE — Telephone Encounter (Signed)
Pt is aware to keep appointment and to keep working on getting sample. Pt will try to come by the lab today to get new sputum cup-if unable to make it today then he will come by on Monday. Nothing more needed at this time.

## 2018-01-28 ENCOUNTER — Encounter: Payer: Self-pay | Admitting: Pulmonary Disease

## 2018-01-28 ENCOUNTER — Ambulatory Visit: Payer: BLUE CROSS/BLUE SHIELD | Admitting: Pulmonary Disease

## 2018-01-28 VITALS — BP 120/80 | HR 89 | Ht 71.0 in | Wt 249.0 lb

## 2018-01-28 DIAGNOSIS — J479 Bronchiectasis, uncomplicated: Secondary | ICD-10-CM

## 2018-01-28 MED ORDER — SODIUM CHLORIDE 3 % IN NEBU: INHALATION_SOLUTION | Freq: Two times a day (BID) | RESPIRATORY_TRACT | 11 refills | Status: DC

## 2018-01-28 NOTE — Patient Instructions (Signed)
Bronchiectasis: Use hypertonic saline nebulizers twice a day no matter how you feel Call me if you have increasing mucus production chest congestion or fevers or chills and we will call in an antibiotic Provide [place] with a sample of mucus which we can send for bacteria, fungus, AFB culture Follow-up with me in 1 year or sooner if need
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
## 2018-07-27 IMAGING — MR MR HEAD WO/W CM
10 of 12 series · 35 of 48 positions shown · IV contrast (multihance)
Comparison: Prior CT from earlier the same day.

CLINICAL DATA: Initial evaluation for acute ataxia, suspect stroke.

EXAM:
MRI HEAD WITHOUT AND WITH CONTRAST
TECHNIQUE: Multiplanar, multiecho pulse sequences of the brain and surrounding
structures were obtained without and with intravenous contrast.
CONTRAST:  <See Chart> MULTIHANCE GADOBENATE DIMEGLUMINE 529 MG/ML
IV SOLN

[Series 3: DWI · axial · 3.0mm · 1.09mm/px · z∈[-75,+74]mm · 9 of 102 slices shown (1 of 4)]
[im 1/102]
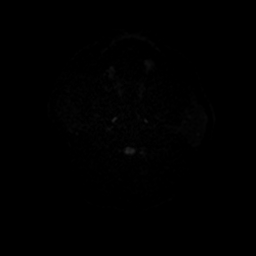
[im 12/102]
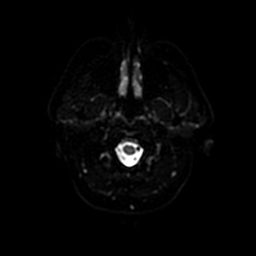
[im 23/102]
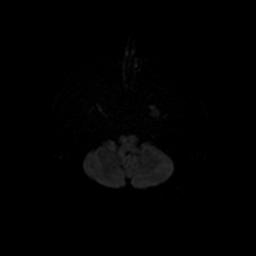
[im 34/102]
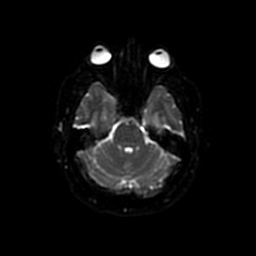
[im 45/102]
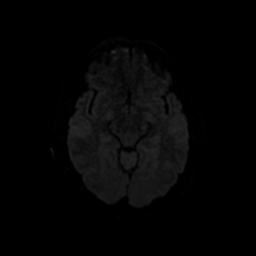
[im 57/102]
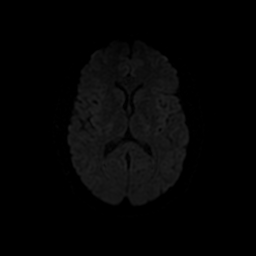
[im 68/102]
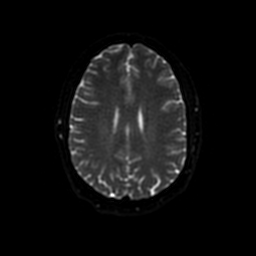
[im 90/102]
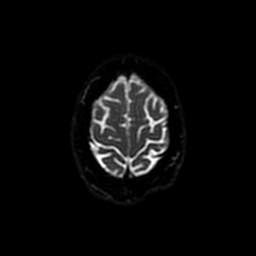
[im 102/102]
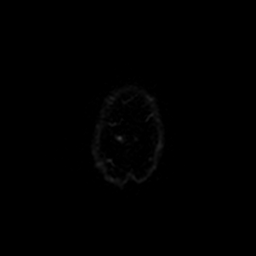

[Series 4: DWI · coronal · 5.0mm · 1.09mm/px · 7 of 80 slices shown (2 of 4)]
[im 1/80]
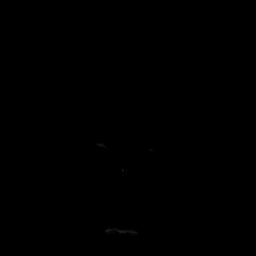
[im 14/80]
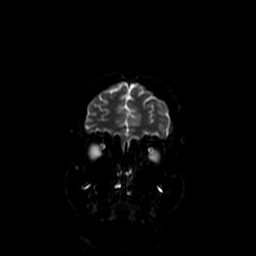
[im 27/80]
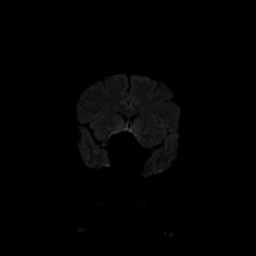
[im 40/80]
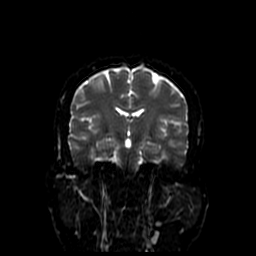
[im 53/80]
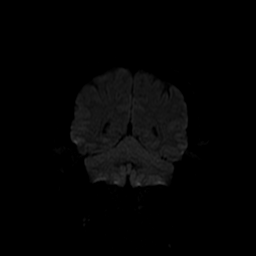
[im 66/80]
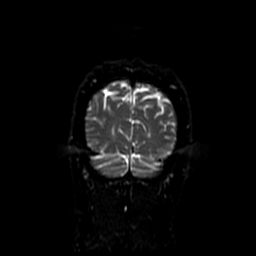
[im 80/80]
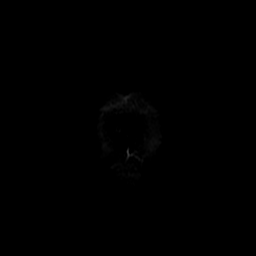

[Series 5: T1 · sagittal · 5.0mm · 0.47mm/px · 2 of 23 slices shown]
[im 1/23]
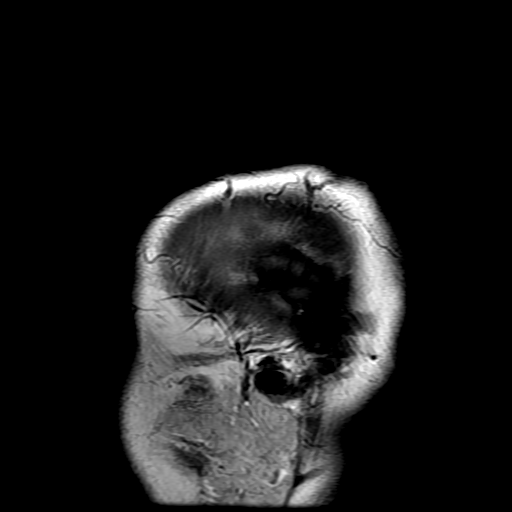
[im 23/23]
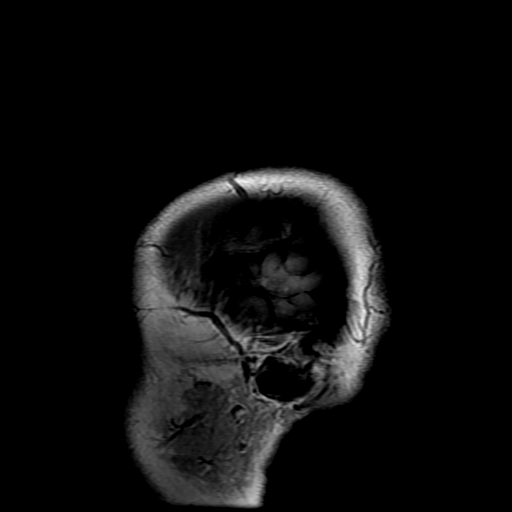

[Series 6: T2 · axial · 5.0mm · 0.47mm/px · z∈[-73,+70]mm · 2 of 25 slices shown]
[im 1/25]
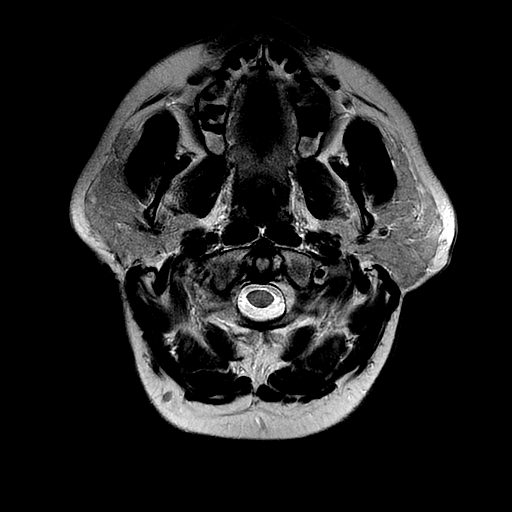
[im 25/25]
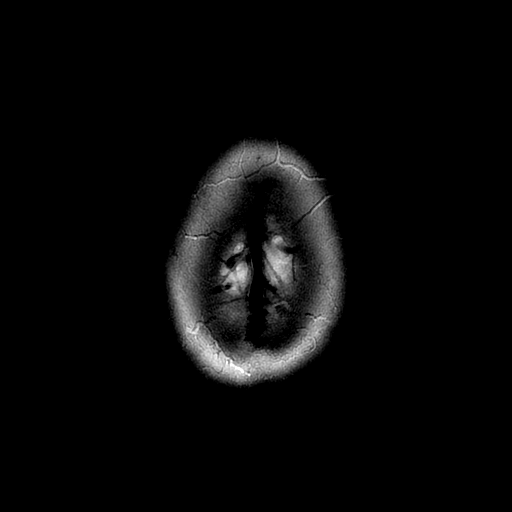

[Series 7: FLAIR · axial · 5.0mm · 0.47mm/px · z∈[-73,+70]mm · 2 of 25 slices shown]
[im 1/25]
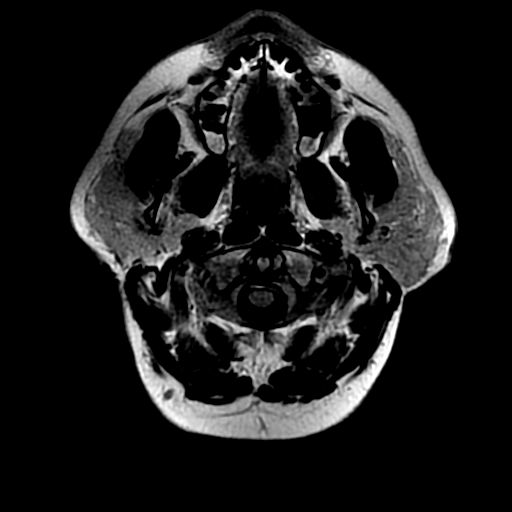
[im 25/25]
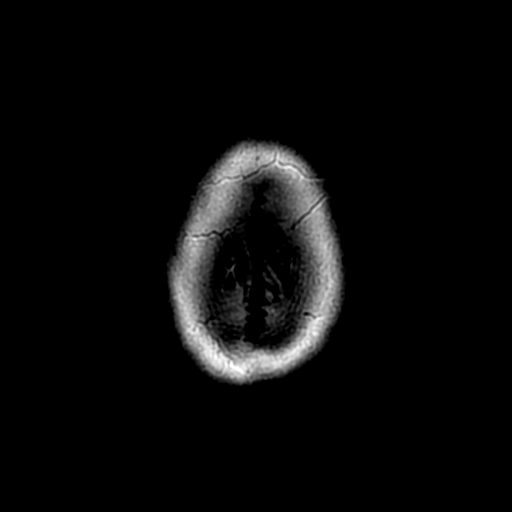

[Series 8: ax mpgr · axial · 5.0mm · 0.47mm/px · z∈[-70,+73]mm · 2 of 25 slices shown]
[im 1/25]
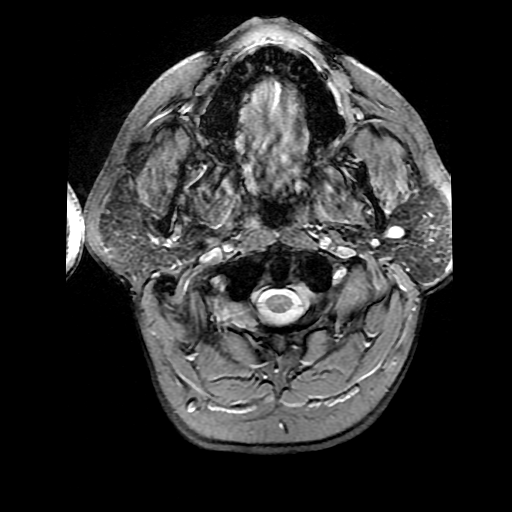
[im 25/25]
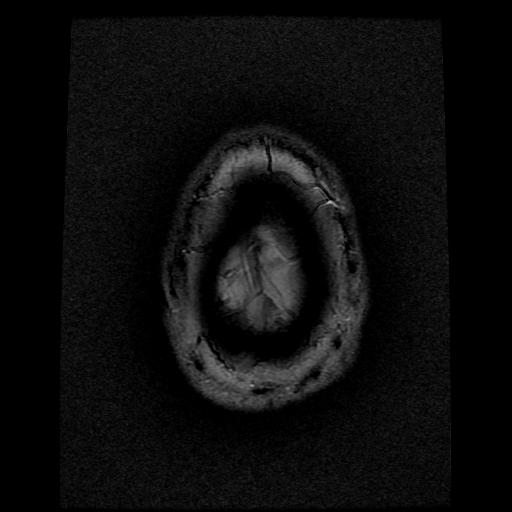

[Series 10: T2 post-contrast · coronal · 5.0mm · 0.39mm/px · 2 of 26 slices shown]
[im 1/26]
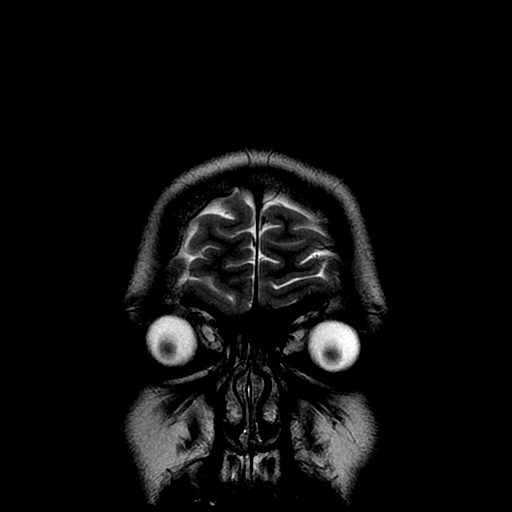
[im 26/26]
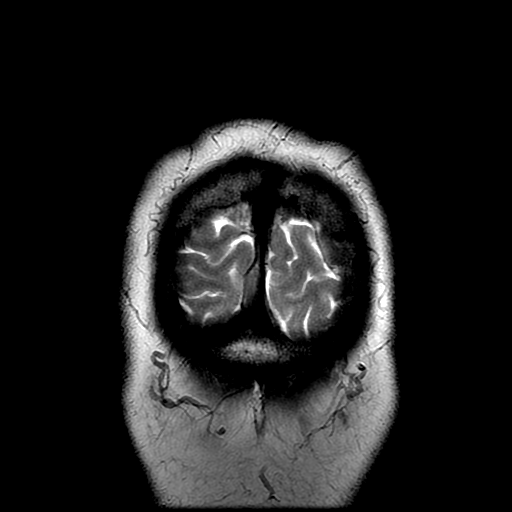

[Series 12: T1 post-contrast · coronal · 5.0mm · 0.39mm/px · 2 of 26 slices shown]
[im 1/26]
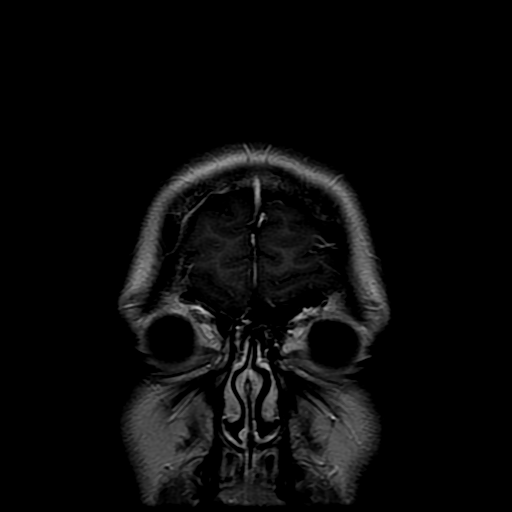
[im 26/26]
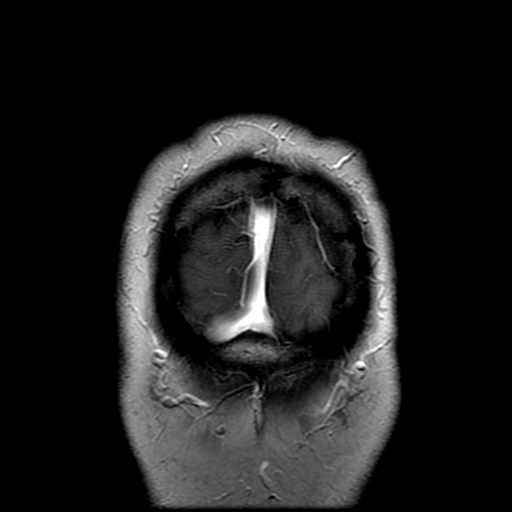

[Series 300: DWI · axial · 3.0mm · 1.09mm/px · z∈[-75,+74]mm · 4 of 51 slices shown (3 of 4)]
[im 1/51]
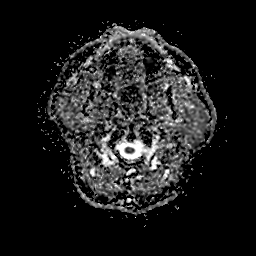
[im 17/51]
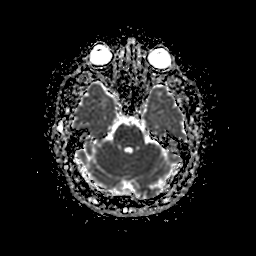
[im 34/51]
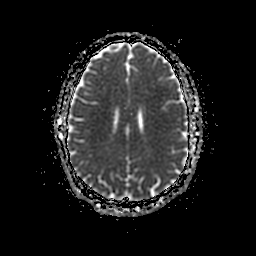
[im 51/51]
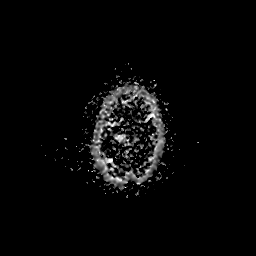

[Series 400: DWI · coronal · 5.0mm · 1.09mm/px · 3 of 40 slices shown (4 of 4)]
[im 1/40]
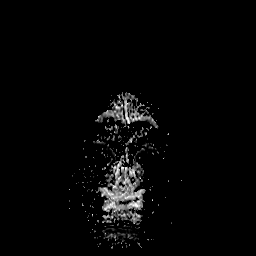
[im 20/40]
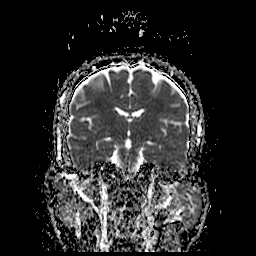
[im 40/40]
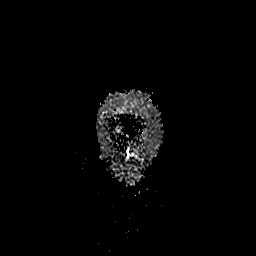

[35 of 48 positions shown; findings below may reference images not displayed]

FINDINGS: Brain: Cerebral volume normal for age. No focal parenchymal signal
abnormality. No abnormal foci of restricted diffusion to suggest
acute or subacute ischemia. Gray-white matter differentiation
maintained. No encephalomalacia to suggest chronic infarction. No
foci of susceptibility artifact to suggest acute or chronic
intracranial hemorrhage.

No mass lesion, midline shift or mass effect. No hydrocephalus. No
extra-axial fluid collection. Major dural sinuses are patent. No
abnormal enhancement.

Pituitary gland suprasellar region normal.

Vascular: Major intracranial vascular flow voids maintained.

Skull and upper cervical spine: Craniocervical junction normal.
Upper cervical spine unremarkable. Bone marrow signal intensity
within normal limits. No scalp soft tissue abnormality.

Sinuses/Orbits: Globes and orbital soft tissues within normal
limits. Paranasal sinuses are clear. No mastoid effusion. Inner ear
structures normal.

Other: None.
IMPRESSION: Normal brain MRI. No acute intracranial infarct or other process
identified.

## 2018-10-17 ENCOUNTER — Ambulatory Visit: Payer: BLUE CROSS/BLUE SHIELD | Admitting: Primary Care

## 2018-10-17 ENCOUNTER — Encounter: Payer: Self-pay | Admitting: Primary Care

## 2018-10-17 ENCOUNTER — Telehealth: Payer: Self-pay | Admitting: Primary Care

## 2018-10-17 VITALS — BP 138/80 | HR 97 | Temp 99.0°F | Ht 71.0 in | Wt 252.4 lb

## 2018-10-17 DIAGNOSIS — J029 Acute pharyngitis, unspecified: Secondary | ICD-10-CM | POA: Diagnosis not present

## 2018-10-17 DIAGNOSIS — J479 Bronchiectasis, uncomplicated: Secondary | ICD-10-CM | POA: Diagnosis not present

## 2018-10-17 DIAGNOSIS — J111 Influenza due to unidentified influenza virus with other respiratory manifestations: Secondary | ICD-10-CM

## 2018-10-17 DIAGNOSIS — R69 Illness, unspecified: Secondary | ICD-10-CM | POA: Diagnosis not present

## 2018-10-17 LAB — RESPIRATORY VIRUS PANEL
INFLUENZA B RNA: NOT DETECTED
Influenza A RNA: NOT DETECTED
RSV RNA: NOT DETECTED
hMPV: NOT DETECTED

## 2018-10-17 LAB — BETA STREP SCREEN: Streptococcus, Group A Screen (Direct): NEGATIVE
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
## 2018-10-17 NOTE — Telephone Encounter (Signed)
ATC Patient.  [phone], only number provided is not in service at this time. Called Marchelle Folks (Wife), [phone], also got recording of number not in service at this time.

## 2018-10-17 NOTE — Assessment & Plan Note (Addendum)
-   Checking respiratory viral panel and beta strep  - Advised rest, tylenol prn and oral hydration  - Return if symptoms do not improve or worsen

## 2018-10-17 NOTE — Assessment & Plan Note (Addendum)
-   Does not appear acutely exacerbated  - LSC on exam, O2 sat 100% RA  - Holding off on antibiotics until lab test resulted - Advised mucinex and resume hypertonic saline neb for congestion

## 2018-10-17 NOTE — Patient Instructions (Addendum)
  Office testing: Beta Strep screen  Respiratory viral panel ( we will call with results)  Recommendations: Stay well hydrated (aim for eight 8 oz glasses) and get plenty of rest  Take mucinex twice a day Resume hypertonnic saline nebs twice daily for congestion Tylenol as needed for fever/body aches - 650mg  every 4 hours (do not exceed 4grams in 24 hours)  *Holding off on antibiotics/anti-virals until testing resulted   Follow-up: Return if no improvement or symptoms worsen

## 2018-10-20 NOTE — Progress Notes (Signed)
Reviewed, agree 

## 2018-10-20 NOTE — Telephone Encounter (Signed)
ATC pt, received a message that this phone number was not in service.

## 2018-10-21 MED ORDER — DOXYCYCLINE HYCLATE 100 MG PO TABS: 100.0000 mg | ORAL_TABLET | Freq: Two times a day (BID) | ORAL | 0 refills | Status: DC

## 2018-10-21 MED ORDER — SODIUM CHLORIDE 3 % IN NEBU: INHALATION_SOLUTION | RESPIRATORY_TRACT | 3 refills | Status: DC | PRN

## 2018-10-21 NOTE — Telephone Encounter (Signed)
Advise hypertonic saline nebs twice daily and will send in doxycyline course. Thank you

## 2018-10-21 NOTE — Telephone Encounter (Signed)
ATC all the numbers we have on file for the pt. None of the numbers are in working order. Message will be closed at this time.

## 2018-10-21 NOTE — Telephone Encounter (Signed)
Called and spoke with patient, he is aware and verbalized understanding. Nothing further needed.  

## 2018-10-21 NOTE — Telephone Encounter (Signed)
Pt called in for results.  Relayed results.  He complained for coughing up more gray mucus and sore throat.  He denies chills, fever, or body aches.  He is taking mucinex.  Did not pick up hypertonic nebs.  Per result notes, Luis [name] said if he was not feeling better may do a round of doxycycline.  Beth, please advise if it is ok to call in doxycycline or any other recommendations.

## 2018-10-21 NOTE — Addendum Note (Signed)
Addended by: Glenford Bayley on: 10/21/2018 02:22 PM   Modules accepted: Orders

## 2018-11-26 ENCOUNTER — Emergency Department (HOSPITAL_COMMUNITY)
Admission: EM | Admit: 2018-11-26 | Discharge: 2018-11-27 | Disposition: A | Discharge status: Home / Self Care | Payer: BLUE CROSS/BLUE SHIELD | Attending: Emergency Medicine | Admitting: Emergency Medicine

## 2018-11-26 ENCOUNTER — Emergency Department (HOSPITAL_COMMUNITY): Payer: BLUE CROSS/BLUE SHIELD

## 2018-11-26 ENCOUNTER — Encounter (HOSPITAL_COMMUNITY): Payer: Self-pay

## 2018-11-26 ENCOUNTER — Other Ambulatory Visit: Payer: Self-pay

## 2018-11-26 ENCOUNTER — Ambulatory Visit (HOSPITAL_COMMUNITY)
Admission: EM | Admit: 2018-11-26 | Discharge: 2018-11-26 | Disposition: A | Discharge status: Home / Self Care | Payer: BLUE CROSS/BLUE SHIELD | Source: Home / Self Care

## 2018-11-26 DIAGNOSIS — M5412 Radiculopathy, cervical region: Secondary | ICD-10-CM | POA: Diagnosis not present

## 2018-11-26 DIAGNOSIS — J45909 Unspecified asthma, uncomplicated: Secondary | ICD-10-CM | POA: Diagnosis not present

## 2018-11-26 DIAGNOSIS — Z79899 Other long term (current) drug therapy: Secondary | ICD-10-CM | POA: Insufficient documentation

## 2018-11-26 DIAGNOSIS — M542 Cervicalgia: Secondary | ICD-10-CM | POA: Diagnosis not present

## 2018-11-26 NOTE — ED Triage Notes (Signed)
Pt states neck pain hx of discectomy. Pt sent here for further imaging by neurosurgeon. Pt states neck pain with some radiation to his left arm.

## 2018-11-26 NOTE — ED Triage Notes (Signed)
Per pt access, pt sent to the ER by Parker Bing

## 2018-11-26 NOTE — ED Notes (Signed)
Pt placed in gown, consent signed, jewelry removed prior to pt transport to short stay. Family member accompanied pt and had belongings in her possession.
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
## 2018-11-26 NOTE — Discharge Instructions (Addendum)
Please read attached information. If you experience any new or worsening signs or symptoms please return to the emergency room for evaluation. Please follow-up with your primary care provider or specialist as discussed.  °
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
## 2018-11-28 ENCOUNTER — Other Ambulatory Visit: Payer: Self-pay

## 2018-11-28 ENCOUNTER — Ambulatory Visit (INDEPENDENT_AMBULATORY_CARE_PROVIDER_SITE_OTHER): Payer: BLUE CROSS/BLUE SHIELD

## 2018-11-28 ENCOUNTER — Telehealth: Payer: Self-pay | Admitting: Pulmonary Disease

## 2018-11-28 DIAGNOSIS — J479 Bronchiectasis, uncomplicated: Secondary | ICD-10-CM | POA: Diagnosis not present

## 2018-11-28 DIAGNOSIS — R05 Cough: Secondary | ICD-10-CM | POA: Diagnosis not present

## 2018-11-28 MED ORDER — SODIUM CHLORIDE 3 % IN NEBU: INHALATION_SOLUTION | RESPIRATORY_TRACT | 3 refills | Status: DC | PRN

## 2018-11-28 MED ORDER — DOXYCYCLINE HYCLATE 100 MG PO TABS: 100.0000 mg | ORAL_TABLET | Freq: Two times a day (BID) | ORAL | 0 refills | Status: DC

## 2018-11-28 NOTE — Telephone Encounter (Signed)
Also then decide to send in medication then.

## 2018-11-28 NOTE — Telephone Encounter (Signed)
Beth said that she would call patient with the results and she will him with results.

## 2018-11-28 NOTE — Telephone Encounter (Signed)
Patient is returning phone call.  Patient phone number is [phone].

## 2018-11-28 NOTE — Telephone Encounter (Signed)
Attempted to call pt but unable to reach. Left message for pt to return call. 

## 2018-11-28 NOTE — Telephone Encounter (Signed)
Pt f/u on Rx that was supposed to be prescribed

## 2018-11-28 NOTE — Telephone Encounter (Signed)
Reports productive cough with green/bloody sputum.Had night sweats last night. No documented fever. Hx bronchiectasis. Symptoms started yesterday. Wife recently returned from [facility] one week ago. She is doing fine. No recent travel himself. No wheezing, no sob, no fever.

## 2018-11-28 NOTE — Telephone Encounter (Signed)
Spoke with Product/process development scientist. Ok for patient to come in for CXR. Needs Mask upon arrival.   CXR clear. This does not sound like more than an exacerbation of his bronchiectasis.  Sending in course of Doxycyline. Has not been using hypertonic saline neb, advised he do so. Stressed importance of monitoring for fever and sob.
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
## 2019-01-28 ENCOUNTER — Emergency Department (HOSPITAL_COMMUNITY)
Admission: EM | Admit: 2019-01-28 | Discharge: 2019-01-28 | Disposition: A | Discharge status: Home / Self Care | Payer: BLUE CROSS/BLUE SHIELD | Attending: Emergency Medicine | Admitting: Emergency Medicine

## 2019-01-28 ENCOUNTER — Other Ambulatory Visit: Payer: Self-pay

## 2019-01-28 ENCOUNTER — Telehealth: Payer: Self-pay | Admitting: Primary Care

## 2019-01-28 ENCOUNTER — Encounter (HOSPITAL_COMMUNITY): Payer: Self-pay | Admitting: Emergency Medicine

## 2019-01-28 ENCOUNTER — Emergency Department (HOSPITAL_COMMUNITY): Payer: BLUE CROSS/BLUE SHIELD

## 2019-01-28 DIAGNOSIS — R0602 Shortness of breath: Secondary | ICD-10-CM | POA: Diagnosis not present

## 2019-01-28 DIAGNOSIS — J705 Respiratory conditions due to smoke inhalation: Secondary | ICD-10-CM | POA: Insufficient documentation

## 2019-01-28 DIAGNOSIS — Z8709 Personal history of other diseases of the respiratory system: Secondary | ICD-10-CM | POA: Diagnosis not present

## 2019-01-28 DIAGNOSIS — R0789 Other chest pain: Secondary | ICD-10-CM | POA: Diagnosis not present

## 2019-01-28 LAB — COMPREHENSIVE METABOLIC PANEL
ALT: 74 U/L — ABNORMAL HIGH (ref 0–44)
AST: 42 U/L — ABNORMAL HIGH (ref 15–41)
Albumin: 4.3 g/dL (ref 3.5–5.0)
Alkaline Phosphatase: 53 U/L (ref 38–126)
Anion gap: 11 (ref 5–15)
BUN: 12 mg/dL (ref 6–20)
CO2: 24 mmol/L (ref 22–32)
Calcium: 9.5 mg/dL (ref 8.9–10.3)
Chloride: 104 mmol/L (ref 98–111)
Creatinine, Ser: 1.01 mg/dL (ref 0.61–1.24)
GFR calc Af Amer: >60 mL/min (ref >60)
GFR calc non Af Amer: >60 mL/min (ref >60)
Glucose, Bld: 100 mg/dL — ABNORMAL HIGH (ref 70–99)
Potassium: 4.3 mmol/L (ref 3.5–5.1)
Sodium: 139 mmol/L (ref 135–145)
Total Bilirubin: 0.7 mg/dL (ref 0.3–1.2)
Total Protein: 7.1 g/dL (ref 6.5–8.1)

## 2019-01-28 LAB — CBC WITH DIFFERENTIAL/PLATELET
Abs Immature Granulocytes: 0.06 10*3/uL (ref 0.00–0.07)
Basophils Absolute: 0.1 10*3/uL (ref 0.0–0.1)
Basophils Relative: 1 %
Eosinophils Absolute: 0.2 10*3/uL (ref 0.0–0.5)
Eosinophils Relative: 2 %
HCT: 45.7 % (ref 39.0–52.0)
Hemoglobin: 15.6 g/dL (ref 13.0–17.0)
Immature Granulocytes: 1 %
Lymphocytes Relative: 31 %
Lymphs Abs: 2.1 10*3/uL (ref 0.7–4.0)
MCH: 32.8 pg (ref 26.0–34.0)
MCHC: 34.1 g/dL (ref 30.0–36.0)
MCV: 96.2 fL (ref 80.0–100.0)
Monocytes Absolute: 0.7 10*3/uL (ref 0.1–1.0)
Monocytes Relative: 11 %
Neutro Abs: 3.8 10*3/uL (ref 1.7–7.7)
Neutrophils Relative %: 54 %
Platelets: 211 10*3/uL (ref 150–400)
RBC: 4.75 MIL/uL (ref 4.22–5.81)
RDW: 12.7 % (ref 11.5–15.5)
WBC: 6.9 10*3/uL (ref 4.0–10.5)
nRBC: 0 % (ref 0.0–0.2)

## 2019-01-28 LAB — COOXEMETRY PANEL
Carboxyhemoglobin: 1.4 % (ref 0.5–1.5)
Methemoglobin: 1.7 % — ABNORMAL HIGH (ref 0.0–1.5)
O2 Saturation: 53.5 %
Total hemoglobin: 15.9 g/dL (ref 12.0–16.0)

## 2019-01-28 NOTE — Discharge Instructions (Signed)
Your laboratory results were within normal limits today. Your chest xray was normal. Please follow up with your Pulmonologist at your earliest convenience. If you experience any chest pain, shortness of breath or worsening symptoms you may return to the ED.
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
## 2019-05-28 ENCOUNTER — Telehealth: Payer: Self-pay | Admitting: Pulmonary Disease

## 2019-05-28 NOTE — Telephone Encounter (Signed)
Spoke with the pt and notified of recs per Rexene Edison, NP  He verbalized understanding  Nothing further needed
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
## 2019-05-28 NOTE — Telephone Encounter (Signed)
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 He would like to make sure from his doctor that this is not a good idea, especially for him with only one functioning lung. I advised him that I am not sure if they could practice social distancing with 250 people because it depends on how big the area is to spread everyone 6 feet apart. . Please advise.

## 2019-07-15 DIAGNOSIS — M25511 Pain in right shoulder: Secondary | ICD-10-CM | POA: Diagnosis not present

## 2019-07-15 DIAGNOSIS — S46011A Strain of muscle(s) and tendon(s) of the rotator cuff of right shoulder, initial encounter: Secondary | ICD-10-CM | POA: Diagnosis not present

## 2019-07-27 DIAGNOSIS — M25511 Pain in right shoulder: Secondary | ICD-10-CM | POA: Diagnosis not present

## 2019-07-28 DIAGNOSIS — M6281 Muscle weakness (generalized): Secondary | ICD-10-CM | POA: Diagnosis not present

## 2019-07-28 DIAGNOSIS — M7501 Adhesive capsulitis of right shoulder: Secondary | ICD-10-CM | POA: Diagnosis not present

## 2019-07-28 DIAGNOSIS — S46011D Strain of muscle(s) and tendon(s) of the rotator cuff of right shoulder, subsequent encounter: Secondary | ICD-10-CM | POA: Diagnosis not present

## 2019-07-28 DIAGNOSIS — M25611 Stiffness of right shoulder, not elsewhere classified: Secondary | ICD-10-CM | POA: Diagnosis not present

## 2019-08-10 ENCOUNTER — Other Ambulatory Visit: Payer: Self-pay

## 2019-08-10 DIAGNOSIS — M25611 Stiffness of right shoulder, not elsewhere classified: Secondary | ICD-10-CM | POA: Diagnosis not present

## 2019-08-10 DIAGNOSIS — M6281 Muscle weakness (generalized): Secondary | ICD-10-CM | POA: Diagnosis not present

## 2019-08-10 DIAGNOSIS — Z20822 Contact with and (suspected) exposure to covid-19: Secondary | ICD-10-CM

## 2019-08-10 DIAGNOSIS — S46011D Strain of muscle(s) and tendon(s) of the rotator cuff of right shoulder, subsequent encounter: Secondary | ICD-10-CM | POA: Diagnosis not present

## 2019-08-10 DIAGNOSIS — M7501 Adhesive capsulitis of right shoulder: Secondary | ICD-10-CM | POA: Diagnosis not present

## 2019-08-11 LAB — NOVEL CORONAVIRUS, NAA: SARS-CoV-2, NAA: NOT DETECTED

## 2019-09-14 DIAGNOSIS — Z20828 Contact with and (suspected) exposure to other viral communicable diseases: Secondary | ICD-10-CM | POA: Diagnosis not present

## 2019-09-28 ENCOUNTER — Telehealth: Payer: Self-pay | Admitting: Critical Care Medicine

## 2019-09-30 ENCOUNTER — Telehealth (INDEPENDENT_AMBULATORY_CARE_PROVIDER_SITE_OTHER): Payer: Self-pay | Admitting: Critical Care Medicine

## 2019-09-30 ENCOUNTER — Encounter: Payer: Self-pay | Admitting: Critical Care Medicine

## 2019-09-30 DIAGNOSIS — M5136 Other intervertebral disc degeneration, lumbar region: Secondary | ICD-10-CM | POA: Diagnosis not present

## 2019-09-30 DIAGNOSIS — M7918 Myalgia, other site: Secondary | ICD-10-CM | POA: Diagnosis not present

## 2019-09-30 DIAGNOSIS — M545 Low back pain: Secondary | ICD-10-CM | POA: Diagnosis not present

## 2019-09-30 DIAGNOSIS — J479 Bronchiectasis, uncomplicated: Secondary | ICD-10-CM

## 2019-09-30 DIAGNOSIS — M4726 Other spondylosis with radiculopathy, lumbar region: Secondary | ICD-10-CM | POA: Diagnosis not present

## 2019-09-30 NOTE — Patient Instructions (Addendum)
Thank you for visiting Dr. Chestine Spore at [facility]. We recommend the following:  If you have worsening symptoms, please let us know so we can collect sputum cultures and start you on antibiotics as needed.   Return in about 6 months (around 03/29/2020).    Please do your part to reduce the spread of COVID-19.
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
## 2019-10-06 NOTE — Telephone Encounter (Signed)
Nothing noted in message. Will close encounter.  

## 2019-12-01 DIAGNOSIS — H579 Unspecified disorder of eye and adnexa: Secondary | ICD-10-CM | POA: Diagnosis not present

## 2019-12-01 DIAGNOSIS — N342 Other urethritis: Secondary | ICD-10-CM | POA: Diagnosis not present

## 2019-12-03 DIAGNOSIS — M0230 Reiter's disease, unspecified site: Secondary | ICD-10-CM | POA: Diagnosis not present

## 2019-12-03 DIAGNOSIS — H209 Unspecified iridocyclitis: Secondary | ICD-10-CM | POA: Diagnosis not present

## 2019-12-03 DIAGNOSIS — Z113 Encounter for screening for infections with a predominantly sexual mode of transmission: Secondary | ICD-10-CM | POA: Diagnosis not present

## 2019-12-03 DIAGNOSIS — H3581 Retinal edema: Secondary | ICD-10-CM | POA: Diagnosis not present

## 2019-12-14 DIAGNOSIS — H209 Unspecified iridocyclitis: Secondary | ICD-10-CM | POA: Diagnosis not present

## 2020-03-15 DIAGNOSIS — Z23 Encounter for immunization: Secondary | ICD-10-CM | POA: Diagnosis not present

## 2020-03-15 DIAGNOSIS — T148XXA Other injury of unspecified body region, initial encounter: Secondary | ICD-10-CM | POA: Diagnosis not present

## 2020-05-13 DIAGNOSIS — M4726 Other spondylosis with radiculopathy, lumbar region: Secondary | ICD-10-CM | POA: Diagnosis not present

## 2020-05-13 DIAGNOSIS — M438X2 Other specified deforming dorsopathies, cervical region: Secondary | ICD-10-CM | POA: Diagnosis not present

## 2020-05-13 DIAGNOSIS — M542 Cervicalgia: Secondary | ICD-10-CM | POA: Diagnosis not present

## 2020-05-13 DIAGNOSIS — M545 Low back pain: Secondary | ICD-10-CM | POA: Diagnosis not present

## 2020-05-13 DIAGNOSIS — M47812 Spondylosis without myelopathy or radiculopathy, cervical region: Secondary | ICD-10-CM | POA: Diagnosis not present

## 2020-05-13 DIAGNOSIS — M5136 Other intervertebral disc degeneration, lumbar region: Secondary | ICD-10-CM | POA: Diagnosis not present

## 2020-06-01 DIAGNOSIS — M4726 Other spondylosis with radiculopathy, lumbar region: Secondary | ICD-10-CM | POA: Diagnosis not present

## 2020-06-01 DIAGNOSIS — M5136 Other intervertebral disc degeneration, lumbar region: Secondary | ICD-10-CM | POA: Diagnosis not present

## 2020-06-06 DIAGNOSIS — Z20828 Contact with and (suspected) exposure to other viral communicable diseases: Secondary | ICD-10-CM | POA: Diagnosis not present

## 2020-06-30 DIAGNOSIS — M5136 Other intervertebral disc degeneration, lumbar region: Secondary | ICD-10-CM | POA: Diagnosis not present

## 2020-06-30 DIAGNOSIS — G894 Chronic pain syndrome: Secondary | ICD-10-CM | POA: Diagnosis not present

## 2020-06-30 DIAGNOSIS — M4726 Other spondylosis with radiculopathy, lumbar region: Secondary | ICD-10-CM | POA: Diagnosis not present

## 2020-06-30 DIAGNOSIS — M5459 Other low back pain: Secondary | ICD-10-CM | POA: Diagnosis not present

## 2021-03-29 ENCOUNTER — Telehealth: Payer: Self-pay | Admitting: Primary Care

## 2021-03-29 DIAGNOSIS — J479 Bronchiectasis, uncomplicated: Secondary | ICD-10-CM

## 2021-03-29 NOTE — Telephone Encounter (Signed)
Called and spoke with pt who is requesting to have a referral placed for a new pulmonologist. Pt stated that he now lives in [place], [place] which is about 45 minutes from [place]. Beth please advise if you are okay with referral being placed.

## 2021-03-29 NOTE — Telephone Encounter (Signed)
Attempted to call pt but unable to reach. Left message for him to return call. °

## 2021-03-29 NOTE — Telephone Encounter (Signed)
Spoke with pt and notified him referral would be placed. Pt stated understanding. Nothing further needed at this time.

## 2021-03-29 NOTE — Telephone Encounter (Signed)
Yes I am.
